# Patient Record
Sex: Female | Born: 2000 | Race: Black or African American | Hispanic: No | Marital: Single | Smoking: Never smoker
Health system: Southern US, Community
[De-identification: ages and names within clinical notes are randomized; demographics above are authoritative.]

---

## 2020-04-10 ENCOUNTER — Encounter: Payer: Self-pay | Admitting: Emergency Medicine

## 2020-04-10 ENCOUNTER — Emergency Department: Payer: Medicaid Other

## 2020-04-10 ENCOUNTER — Other Ambulatory Visit: Payer: Self-pay

## 2020-04-10 ENCOUNTER — Emergency Department
Admission: EM | Admit: 2020-04-10 | Discharge: 2020-04-10 | Disposition: A | Discharge status: Home / Self Care | Payer: Medicaid Other | Attending: Emergency Medicine | Admitting: Emergency Medicine

## 2020-04-10 DIAGNOSIS — Z3A01 Less than 8 weeks gestation of pregnancy: Secondary | ICD-10-CM | POA: Insufficient documentation

## 2020-04-10 DIAGNOSIS — O209 Hemorrhage in early pregnancy, unspecified: Secondary | ICD-10-CM | POA: Diagnosis not present

## 2020-04-10 DIAGNOSIS — O200 Threatened abortion: Secondary | ICD-10-CM | POA: Diagnosis not present

## 2020-04-10 LAB — LIPASE, BLOOD: Lipase: 35 U/L (ref 11–51)

## 2020-04-10 LAB — PREGNANCY, URINE: Preg Test, Ur: POSITIVE — AB

## 2020-04-10 LAB — URINALYSIS, COMPLETE (UACMP) WITH MICROSCOPIC
Bacteria, UA: NONE SEEN
Bilirubin Urine: NEGATIVE
Glucose, UA: NEGATIVE mg/dL
Ketones, ur: NEGATIVE mg/dL
Nitrite: NEGATIVE
Protein, ur: NEGATIVE mg/dL
Specific Gravity, Urine: 1.025 (ref 1.005–1.030)
pH: 7 (ref 5.0–8.0)

## 2020-04-10 LAB — COMPREHENSIVE METABOLIC PANEL
ALT: 21 U/L (ref 0–44)
AST: 20 U/L (ref 15–41)
Albumin: 4.2 g/dL (ref 3.5–5.0)
Alkaline Phosphatase: 61 U/L (ref 38–126)
Anion gap: 6 (ref 5–15)
BUN: 15 mg/dL (ref 6–20)
CO2: 26 mmol/L (ref 22–32)
Calcium: 9.4 mg/dL (ref 8.9–10.3)
Chloride: 104 mmol/L (ref 98–111)
Creatinine, Ser: 0.61 mg/dL (ref 0.44–1.00)
GFR calc Af Amer: >60 mL/min (ref >60)
GFR calc non Af Amer: >60 mL/min (ref >60)
Glucose, Bld: 86 mg/dL (ref 70–99)
Potassium: 3.8 mmol/L (ref 3.5–5.1)
Sodium: 136 mmol/L (ref 135–145)
Total Bilirubin: 0.6 mg/dL (ref 0.3–1.2)
Total Protein: 8.1 g/dL (ref 6.5–8.1)

## 2020-04-10 LAB — CBC
HCT: 32.2 % — ABNORMAL LOW (ref 36.0–46.0)
Hemoglobin: 10.7 g/dL — ABNORMAL LOW (ref 12.0–15.0)
MCH: 27 pg (ref 26.0–34.0)
MCHC: 33.2 g/dL (ref 30.0–36.0)
MCV: 81.1 fL (ref 80.0–100.0)
Platelets: 257 10*3/uL (ref 150–400)
RBC: 3.97 MIL/uL (ref 3.87–5.11)
RDW: 16.7 % — ABNORMAL HIGH (ref 11.5–15.5)
WBC: 5.9 10*3/uL (ref 4.0–10.5)
nRBC: 0 % (ref 0.0–0.2)

## 2020-04-10 LAB — HCG, QUANTITATIVE, PREGNANCY: hCG, Beta Chain, Quant, S: 44388 m[IU]/mL — ABNORMAL HIGH (ref <5)

## 2020-04-10 LAB — ABO/RH: ABO/RH(D): B POS

## 2020-04-10 MED ORDER — SODIUM CHLORIDE 0.9% FLUSH: 3.0000 mL | Freq: Once | INTRAVENOUS | Status: DC

## 2020-04-10 NOTE — Discharge Instructions (Addendum)
Follow-up with your regular doctor for your already scheduled appointment next week.  Return the emergency department if you have severe abdominal pain or heavy bleeding.  Continue to take your prenatal vitamins.  Rest.  Do not do heavy lifting at this time.

## 2020-04-10 NOTE — ED Provider Notes (Signed)
[facility] Emergency Department Provider Note  ____________________________________________   First MD Initiated Contact with Patient 04/10/20 1528     (approximate)  I have reviewed the triage vital signs and the nursing notes.   HISTORY  Chief Complaint Abdominal Pain    HPI Margarette Daffron is a 19 y.o. female presents emergency department with concerns of vaginal bleeding in early pregnancy.  Patient states she had her first ultrasound done last week at the OB/GYN doctor in [place] and they did not see any cardiac activity.  She states she has had some cramping and light vaginal bleeding.  Symptoms started last night.  She denies fever or chills.    History reviewed. No pertinent past medical history.  There are no problems to display for this patient.   History reviewed. No pertinent surgical history.  Prior to Admission medications   Not on File    Allergies Shellfish allergy  History reviewed. No pertinent family history.  Social History Social History   Tobacco Use  . Smoking status: Never Smoker  . Smokeless tobacco: Never Used  Substance Use Topics  . Alcohol use: Not on file  . Drug use: Not on file    Review of Systems  Constitutional: No fever/chills Eyes: No visual changes. ENT: No sore throat. Respiratory: Denies cough Cardiovascular: Denies chest pain Gastrointestinal: Denies abdominal cramping and vaginal bleeding Genitourinary: Negative for dysuria. Musculoskeletal: Negative for back pain. Skin: Negative for rash. Psychiatric: no mood changes,     ____________________________________________   PHYSICAL EXAM:  VITAL SIGNS: ED Triage Vitals [04/10/20 1314]  Enc Vitals Group     BP 101/78     Pulse Rate 80     Resp 18     Temp 98.6 F (37 C)     Temp src      SpO2 99 %     Weight 141 lb (64 kg)     Height 5\' 3"  (1.6 m)     Head Circumference      Peak Flow      Pain Score 2     Pain Loc       Pain Edu?      Excl. in GC?     Constitutional: Alert and oriented. Well appearing and in no acute distress. Eyes: Conjunctivae are normal.  Head: Atraumatic. Nose: No congestion/rhinnorhea.   Neck:  supple no lymphadenopathy noted Cardiovascular: Normal rate, regular rhythm. Heart sounds are normal Respiratory: Normal respiratory effort.  No retractions, lungs c t a  Abd: soft nontender bs normal all 4 quad GU: deferred Musculoskeletal: FROM all extremities, warm and well perfused Neurologic:  Normal speech and language.  Skin:  Skin is warm, dry and intact. No rash noted. Psychiatric: Mood and affect are normal. Speech and behavior are normal.  ____________________________________________   LABS (all labs ordered are listed, but only abnormal results are displayed)  Labs Reviewed  CBC - Abnormal; Notable for the following components:      Result Value   Hemoglobin 10.7 (*)    HCT 32.2 (*)    RDW 16.7 (*)    All other components within normal limits  URINALYSIS, COMPLETE (UACMP) WITH MICROSCOPIC - Abnormal; Notable for the following components:   Color, Urine YELLOW (*)    APPearance CLOUDY (*)    Hgb urine dipstick LARGE (*)    Leukocytes,Ua TRACE (*)    All other components within normal limits  HCG, QUANTITATIVE, PREGNANCY - Abnormal; Notable for the following components:  hCG, Beta Chain, Quant, [place] 47,096 (*)    All other components within normal limits  PREGNANCY, URINE - Abnormal; Notable for the following components:   Preg Test, Ur POSITIVE (*)    All other components within normal limits  LIPASE, BLOOD  COMPREHENSIVE METABOLIC PANEL  ABO/RH   ____________________________________________   ____________________________________________  RADIOLOGY  Ultrasound OB less than 14 weeks shows a IUP 6 weeks with no cardiac activity  ____________________________________________   PROCEDURES  Procedure(s) performed:  No  Procedures    ____________________________________________   INITIAL IMPRESSION / ASSESSMENT AND PLAN / ED COURSE  Pertinent labs & imaging results that were available during my care of the patient were reviewed by me and considered in my medical decision making (see chart for details).   The patient is a 19 year old female presents emergency department with concerns of threatened miscarriage.  Physical exam is unremarkable.  DDx: IUP, threatened miscarriage, subchorionic hemorrhage, ectopic  CBC has decreased H&H of 10.7/32.2, ABO/Rh is being positive, beta hCG is 44,388, POC pregnancy is positive, urinalysis only has trace leuks, comprehensive metabolic panel is normal.  Ultrasound OB less than 14 weeks shows a IUP of approximately 6 weeks with no cardiac activity.  Explained to the patient labs are reassuring, she states she has a history of being anemic.  Explained her this can be common in pregnancy 2.  I encouraged her to rest.  Continue take her prenatal pills.  She already has an appointment with her OB/GYN in [place], [place] for a follow-up appointment this week.  I explained to her they will need to do serial beta hCGs to determine if she is having a miscarriage or not.  Explained to her that everything is still in the correct place however there is no cardiac activity.  Also explained to her you may not see cardiac activity until after 8 weeks of pregnancy.  She states she understands.  She was discharged stable condition in the care of her mother.         As part of my medical decision making, I reviewed the following data within the electronic MEDICAL RECORD NUMBER Nursing notes reviewed and incorporated, Labs reviewed , Old chart reviewed, Radiograph reviewed , Notes from prior ED visits and [place] Controlled Substance Database  ____________________________________________   FINAL CLINICAL IMPRESSION(S) / ED DIAGNOSES  Final diagnoses:  Vaginal bleeding  affecting early pregnancy  Threatened miscarriage in early pregnancy      NEW MEDICATIONS STARTED DURING THIS VISIT:  New Prescriptions   No medications on file     Note:  This document was prepared using Dragon voice recognition software and may include unintentional dictation errors.    Faythe Ghee, PA-C 04/10/20 1548    Jene Every, MD 04/10/20 [phone]

## 2020-04-10 NOTE — ED Notes (Signed)
Pt currently in [place] at this time, will obtain repeat VS when patient returns.

## 2020-04-10 NOTE — ED Triage Notes (Signed)
Pt to ER reports she is approximately [redacted] weeks pregnant.  Reports abdominal cramping and light vaginal bleeding since last night.

## 2020-04-12 ENCOUNTER — Encounter: Payer: Self-pay | Admitting: *Deleted

## 2020-04-12 ENCOUNTER — Other Ambulatory Visit: Payer: Self-pay

## 2020-04-12 DIAGNOSIS — O039 Complete or unspecified spontaneous abortion without complication: Secondary | ICD-10-CM | POA: Insufficient documentation

## 2020-04-12 DIAGNOSIS — Z3A01 Less than 8 weeks gestation of pregnancy: Secondary | ICD-10-CM | POA: Insufficient documentation

## 2020-04-12 DIAGNOSIS — O26891 Other specified pregnancy related conditions, first trimester: Secondary | ICD-10-CM | POA: Diagnosis present

## 2020-04-12 LAB — CBC
HCT: 32.1 % — ABNORMAL LOW (ref 36.0–46.0)
Hemoglobin: 10.4 g/dL — ABNORMAL LOW (ref 12.0–15.0)
MCH: 26.9 pg (ref 26.0–34.0)
MCHC: 32.4 g/dL (ref 30.0–36.0)
MCV: 83.2 fL (ref 80.0–100.0)
Platelets: 270 10*3/uL (ref 150–400)
RBC: 3.86 MIL/uL — ABNORMAL LOW (ref 3.87–5.11)
RDW: 17.2 % — ABNORMAL HIGH (ref 11.5–15.5)
WBC: 9.8 10*3/uL (ref 4.0–10.5)
nRBC: 0 % (ref 0.0–0.2)

## 2020-04-12 LAB — HCG, QUANTITATIVE, PREGNANCY: hCG, Beta Chain, Quant, S: 26569 m[IU]/mL — ABNORMAL HIGH (ref <5)

## 2020-04-12 NOTE — ED Triage Notes (Addendum)
Pt stating she is approx [redacted] weeks pregnant, reporting that she has been having lower abdominal cramping today. She has been having spotting since Sunday (seen here for the same). Returning d/t continued abdominal pain, vaginal bleeding not changed.   (While waiting in lobby for triage, pt states that she feels like something is between her legs, took patient to restroom, pt noted to have increased vaginal bleeding with some clotting. )

## 2020-04-13 ENCOUNTER — Emergency Department
Admission: EM | Admit: 2020-04-13 | Discharge: 2020-04-13 | Disposition: A | Discharge status: Home / Self Care | Payer: Medicaid Other | Attending: Emergency Medicine | Admitting: Emergency Medicine

## 2020-04-13 ENCOUNTER — Emergency Department: Payer: Medicaid Other

## 2020-04-13 DIAGNOSIS — O039 Complete or unspecified spontaneous abortion without complication: Secondary | ICD-10-CM

## 2020-04-13 DIAGNOSIS — O4690 Antepartum hemorrhage, unspecified, unspecified trimester: Secondary | ICD-10-CM

## 2020-04-13 NOTE — ED Notes (Signed)
Pt states she is approximately [redacted] weeks pregnant and started having small amount of vaginal bleeding on Sunday and came to the ED, states everything looked ok at that time but the pain and bleeding progressed, states during the ultrasound she passed a large clot and possibly the gestational sac. States she is having mild cramping at present.

## 2020-04-13 NOTE — ED Provider Notes (Signed)
[facility] Emergency Department Provider Note   ____________________________________________   First MD Initiated Contact with Patient 04/13/20 [phone]     (approximate)  I have reviewed the triage vital signs and the nursing notes.   HISTORY  Chief Complaint Abdominal Pain    HPI Renee Morris is a 19 y.o. female, G1 P0 at approximately 7 weeks of pregnancy presents to the ED complaining of abdominal pain.  Patient reports that she initially noticed a small amount of vaginal bleeding 3 days ago, was evaluated in the ED at that time with ultrasound concerning for failed pregnancy.  She had worsening pain develop last night which she describes as a pelvic cramping, initially severe but has since eased off.  She noticed an increase in bleeding at this time and passed a small clot.  She also reports passing a small piece of tissue just after her ultrasound was performed.  Pain and bleeding have improved since then and she only complains of mild pain currently.  She denies any dysuria, hematuria, fevers, nausea, vomiting, or diarrhea.        History reviewed. No pertinent past medical history.  There are no problems to display for this patient.   History reviewed. No pertinent surgical history.  Prior to Admission medications   Not on File    Allergies Shellfish allergy  No family history on file.  Social History Social History   Tobacco Use  . Smoking status: Never Smoker  . Smokeless tobacco: Never Used  Substance Use Topics  . Alcohol use: Not on file  . Drug use: Not on file    Review of Systems  Constitutional: No fever/chills Eyes: No visual changes. ENT: No sore throat. Cardiovascular: Denies chest pain. Respiratory: Denies shortness of breath. Gastrointestinal: Positive for abdominal pain.  No nausea, no vomiting.  No diarrhea.  No constipation. Genitourinary: Negative for dysuria.  Positive for vaginal bleeding. Musculoskeletal:  Negative for back pain. Skin: Negative for rash. Neurological: Negative for headaches, focal weakness or numbness.  ____________________________________________   PHYSICAL EXAM:  VITAL SIGNS: ED Triage Vitals [04/12/20 2138]  Enc Vitals Group     BP (!) 112/58     Pulse Rate 80     Resp 17     Temp 99.2 F (37.3 C)     Temp Source Oral     SpO2 99 %     Weight      Height      Head Circumference      Peak Flow      Pain Score 5     Pain Loc      Pain Edu?      Excl. in GC?     Constitutional: Alert and oriented. Eyes: Conjunctivae are normal. Head: Atraumatic. Nose: No congestion/rhinnorhea. Mouth/Throat: Mucous membranes are moist. Neck: Normal ROM Cardiovascular: Normal rate, regular rhythm. Grossly normal heart sounds. Respiratory: Normal respiratory effort.  No retractions. Lungs CTAB. Gastrointestinal: Soft and nontender. No distention. Genitourinary: Scant amount of vaginal bleeding with a closed cervical os, no observed tissue or clots. Musculoskeletal: No lower extremity tenderness nor edema. Neurologic:  Normal speech and language. No gross focal neurologic deficits are appreciated. Skin:  Skin is warm, dry and intact. No rash noted. Psychiatric: Mood and affect are normal. Speech and behavior are normal.  ____________________________________________   LABS (all labs ordered are listed, but only abnormal results are displayed)  Labs Reviewed  CBC - Abnormal; Notable for the following components:  Result Value   RBC 3.86 (*)    Hemoglobin 10.4 (*)    HCT 32.1 (*)    RDW 17.2 (*)    All other components within normal limits  HCG, QUANTITATIVE, PREGNANCY - Abnormal; Notable for the following components:   hCG, Beta Chain, Quant, S 26,569 (*)    All other components within normal limits     PROCEDURES  Procedure(s) performed (including Critical Care):  Procedures   ____________________________________________   INITIAL IMPRESSION /  ASSESSMENT AND PLAN / ED COURSE       19 year old female, G1, P0 at approximately 7 weeks of pregnancy, presents to the ED complaining of cramping lower abdominal pain along with worsening vaginal bleeding starting last night.  She reports passing a small amount of tissue since then, after which pain and bleeding have improved.  Pelvic exam shows a closed cervical os with scant bleeding, ultrasound also shows evacuation of previously visualized gestational sac.  This is consistent with completed miscarriage, hemoglobin is stable at this time and given resolving bleeding patient is appropriate for outpatient follow-up with OB/GYN.  She states she has an appointment scheduled for tomorrow, which I have advised her to keep.  She was counseled to return to the ED for new or worsening symptoms, patient agrees with plan.      ____________________________________________   FINAL CLINICAL IMPRESSION(S) / ED DIAGNOSES  Final diagnoses:  Complete miscarriage     ED Discharge Orders    None       Note:  This document was prepared using Dragon voice recognition software and may include unintentional dictation errors.   Chesley Noon, MD 04/13/20 [phone]

## 2020-04-13 NOTE — ED Notes (Signed)
Pt says she has not had to go back to the bathroom since triage

## 2021-10-12 IMAGING — US US OB < 14 WEEKS - US OB TV
1 series · 14 of 28 positions shown · non-contrast
Comparison: None.

CLINICAL DATA: Vaginal bleeding. Quantitative beta HCG [DATE].
Estimated gestational age per LMP 9 weeks 4 days.

EXAM:
OBSTETRIC <14 WK US AND TRANSVAGINAL OB US
TECHNIQUE: Both transabdominal and transvaginal ultrasound examinations were
performed for complete evaluation of the gestation as well as the
maternal uterus, adnexal regions, and pelvic cul-de-sac.
Transvaginal technique was performed to assess early pregnancy.

[Series 1: ob us · 89 acquisitions, 14 frames shown]
[im 4/89]
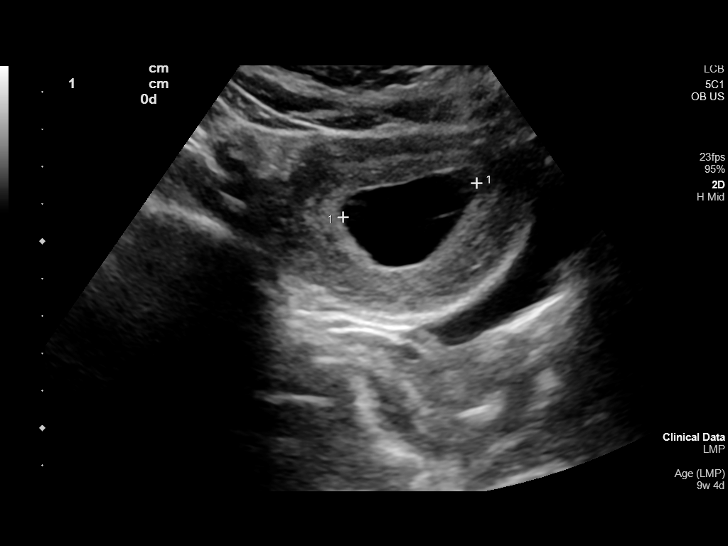
[im 10/89]
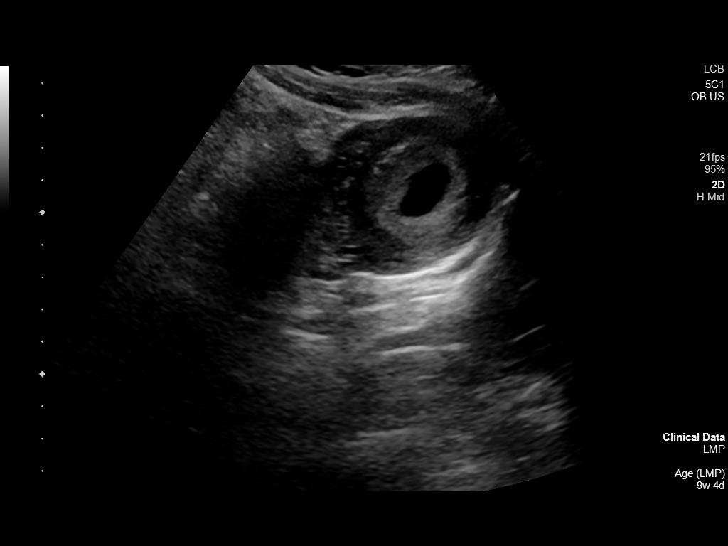
[im 17/89]
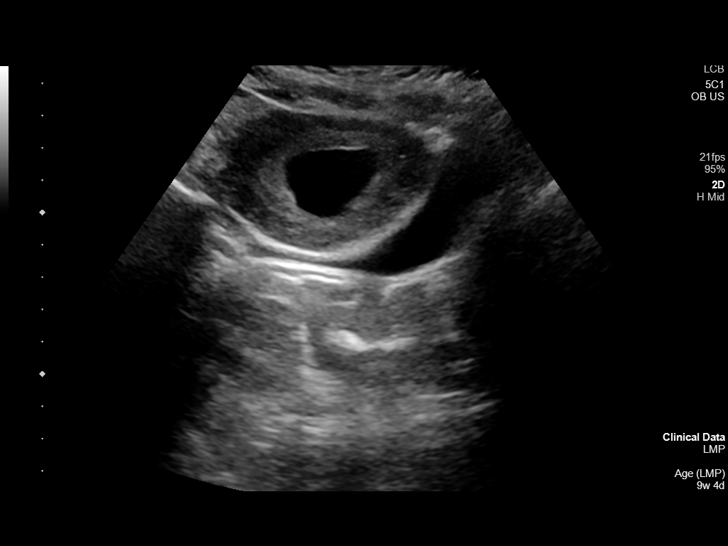
[im 23/89]
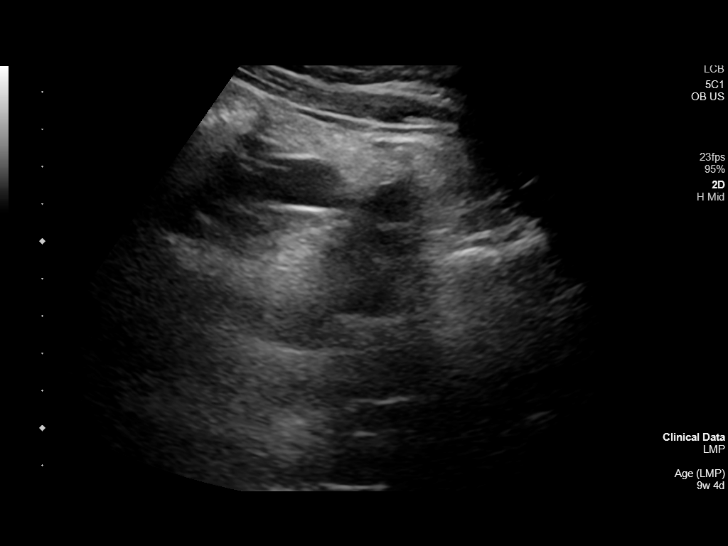
[im 30/89]
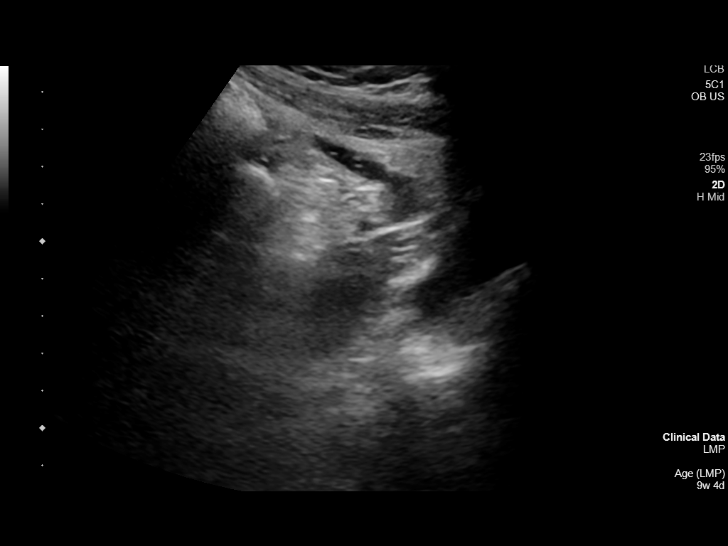
[im 36/89]
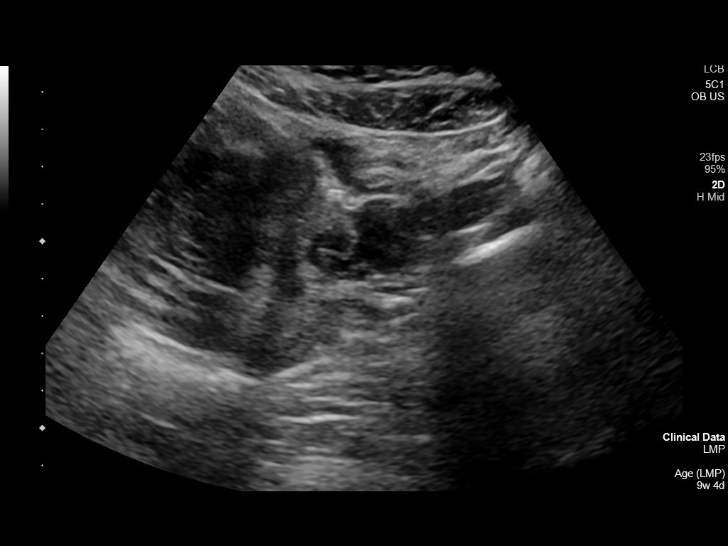
[im 43/89]
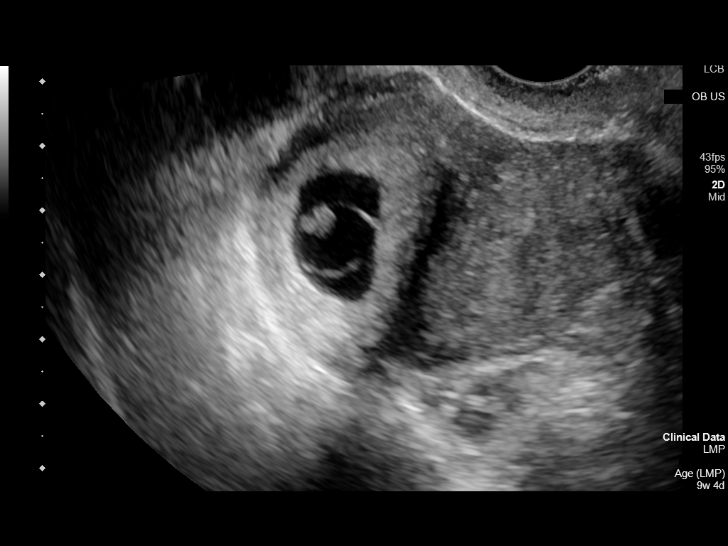
[im 49/89]
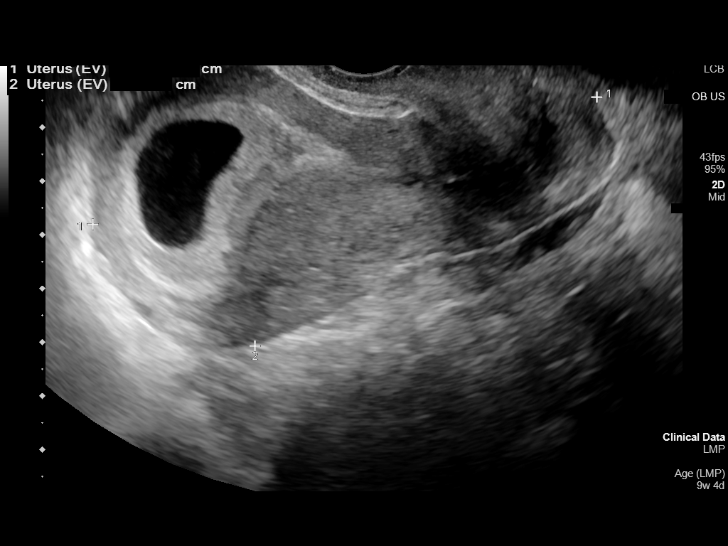
[im 56/89]
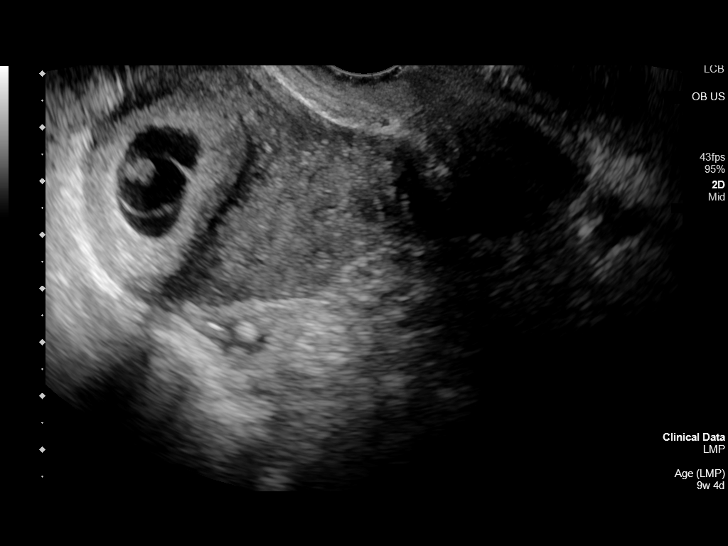
[im 62/89]
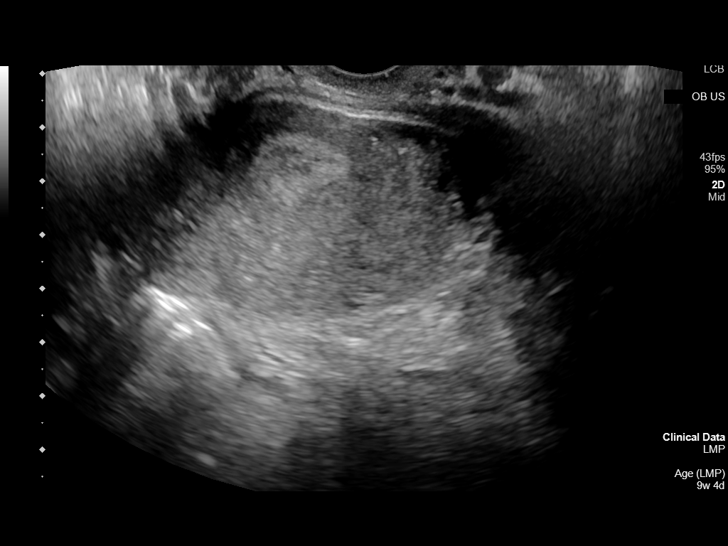
[im 69/89]
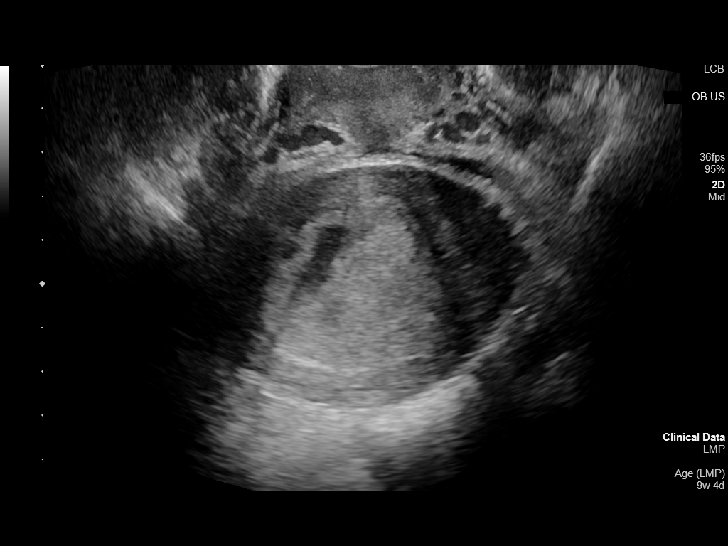
[im 75/89]
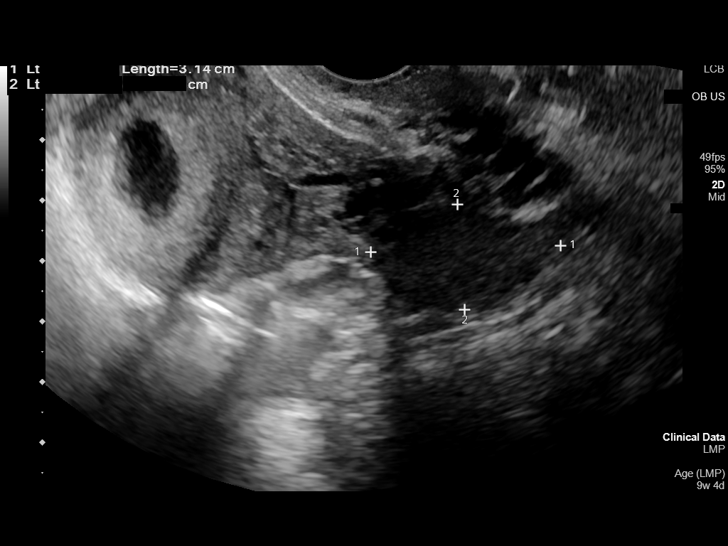
[im 82/89]
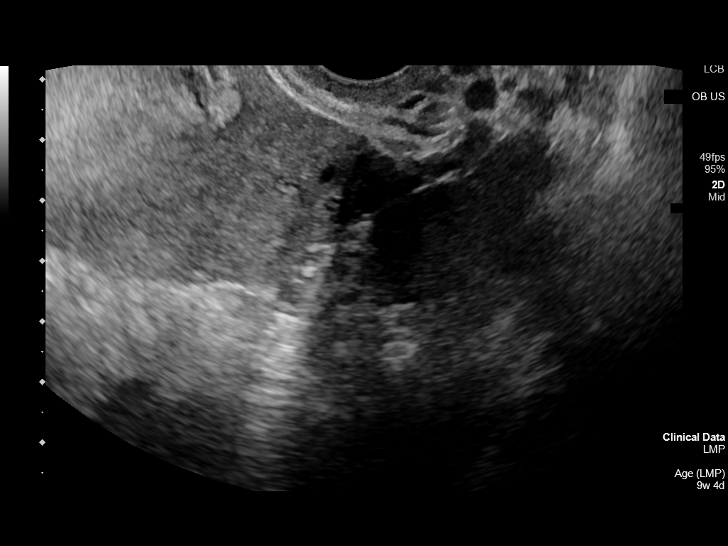
[im 89/89]
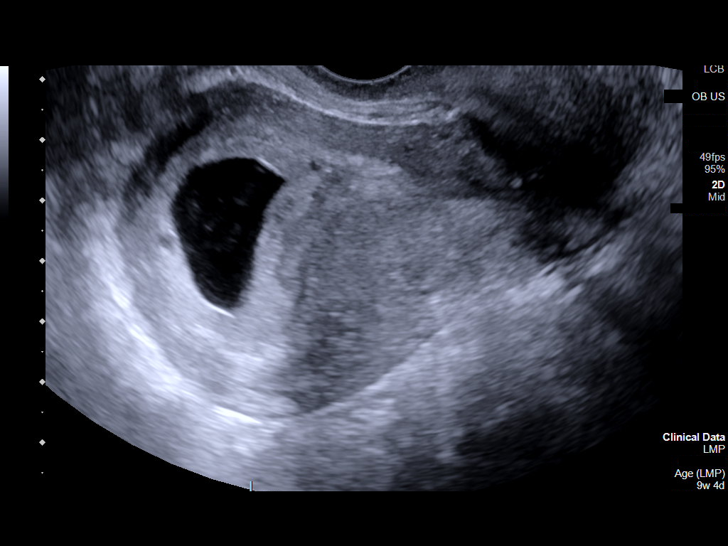

[14 of 28 positions shown; findings below may reference images not displayed]

FINDINGS: Intrauterine gestational sac: Visualized.

Yolk sac:  Visualized.

Embryo:  Visualized.

Cardiac Activity: Not visualized.

Heart Rate: Not visualized.  Bpm

MSD: 28.9 mm

CRL:  5.3 mm   6 w   2 d                  US EDC: 12/02/2020

Subchorionic hemorrhage:  Small subchorionic hemorrhage present.

Maternal uterus/adnexae: Right ovary not visualized. Left ovary is
normal. No free fluid.
IMPRESSION: Intrauterine gestational sac with yolk sac and embryo, but no
visualized cardiac activity. Findings are suspicious for, but not
definitive of failed pregnancy. Recommend follow-up serial
quantitative beta HCG and follow-up ultrasound 7-10 days.

## 2021-10-15 IMAGING — US US OB < 14 WEEKS - US OB TV
1 series · 14 of 28 positions shown · non-contrast
Comparison: Three days ago

CLINICAL DATA: Vaginal bleeding and lower abdominal cramping for 3
days

EXAM:
OBSTETRIC <14 WK US AND TRANSVAGINAL OB US
TECHNIQUE: Both transabdominal and transvaginal ultrasound examinations were
performed for complete evaluation of the gestation as well as the
maternal uterus, adnexal regions, and pelvic cul-de-sac.
Transvaginal technique was performed to assess early pregnancy.

[Series 1: us ob less than 14 weeks with ob transvaginal · 14 of 97 slices shown]
[im 4/97]
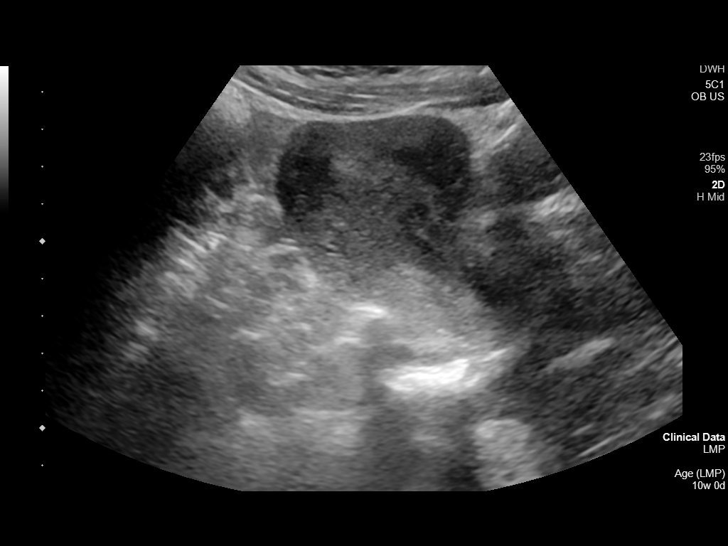
[im 11/97]
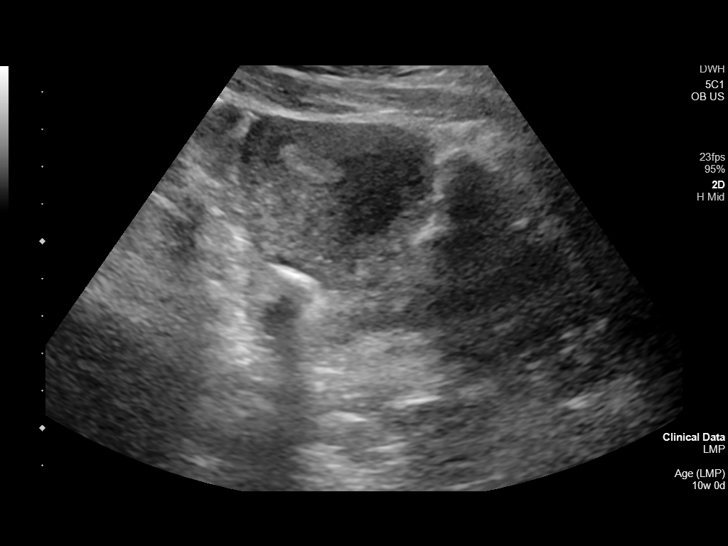
[im 18/97]
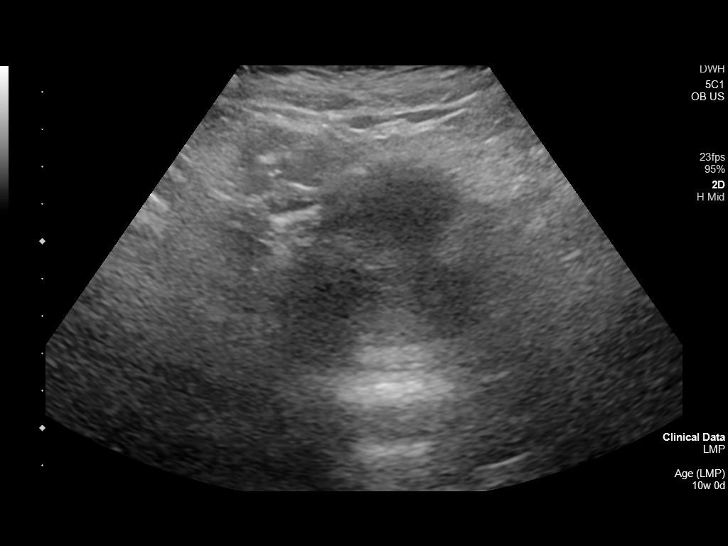
[im 25/97]
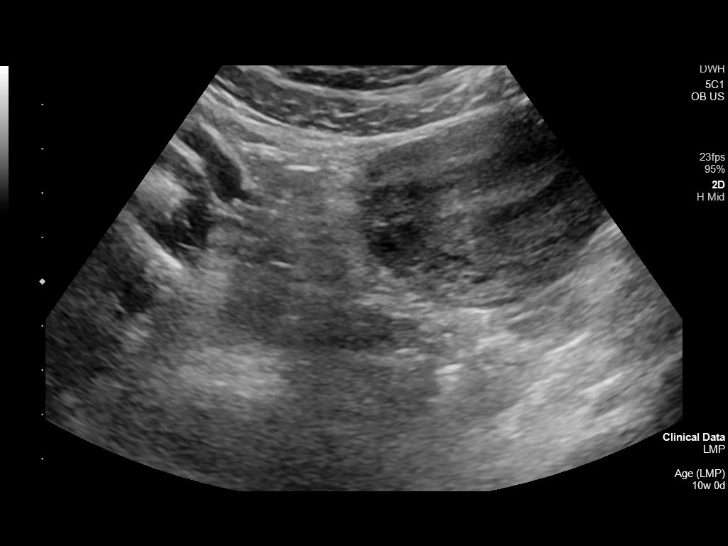
[im 33/97]
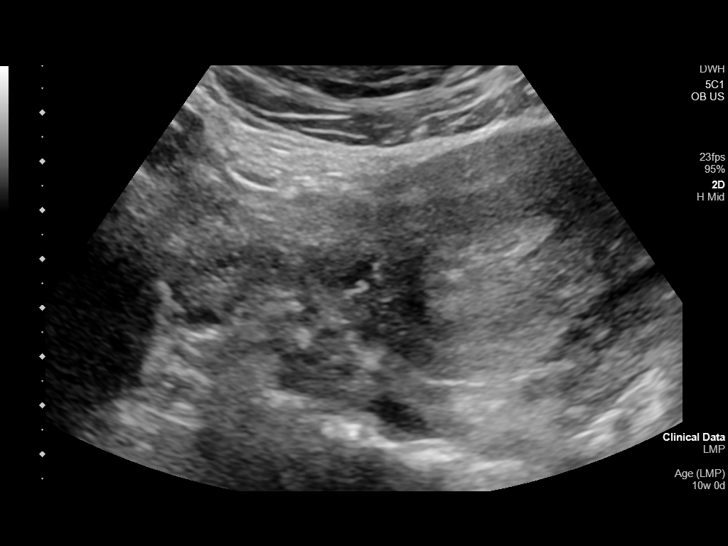
[im 40/97]
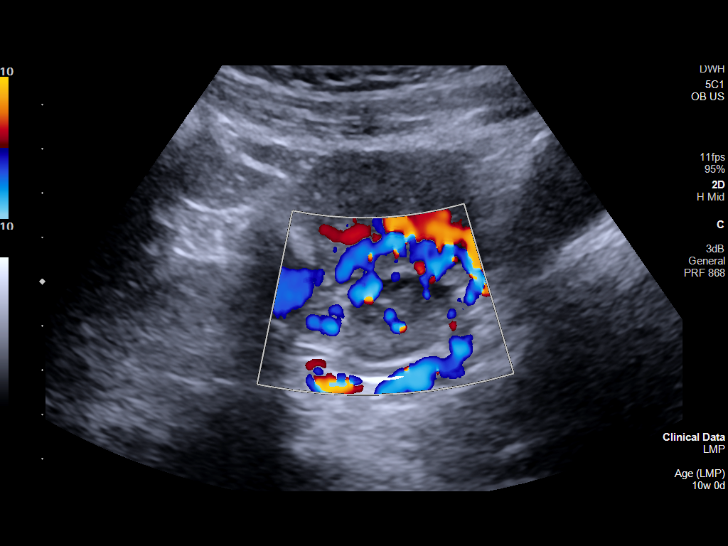
[im 47/97]
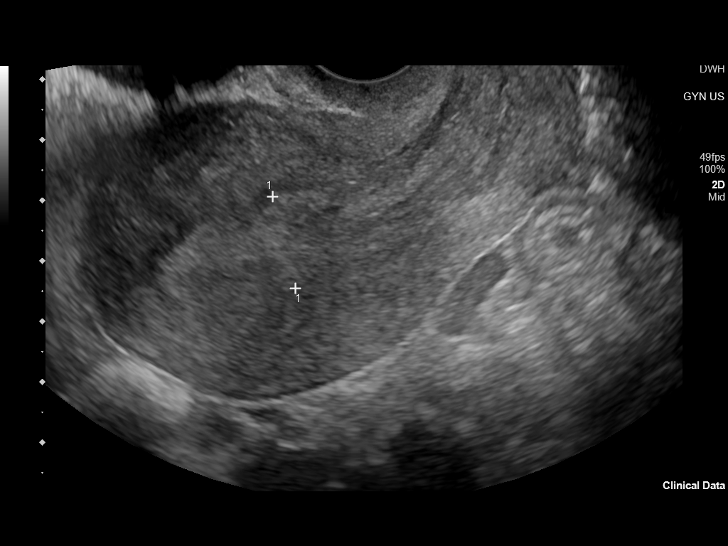
[im 54/97]
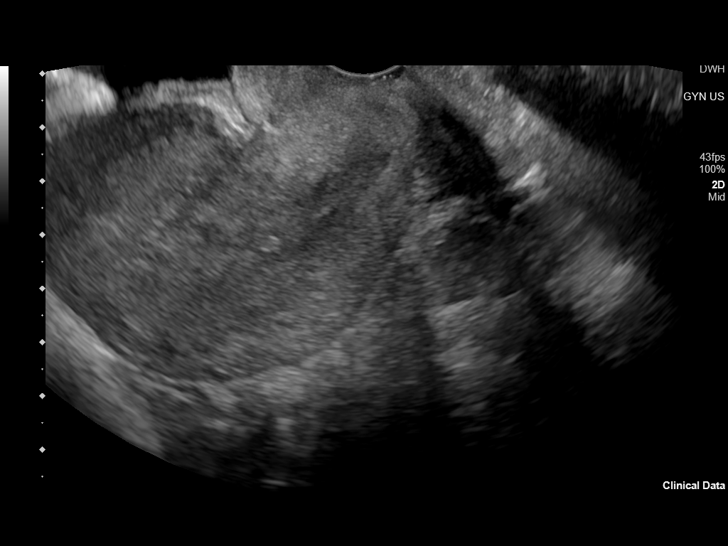
[im 61/97]
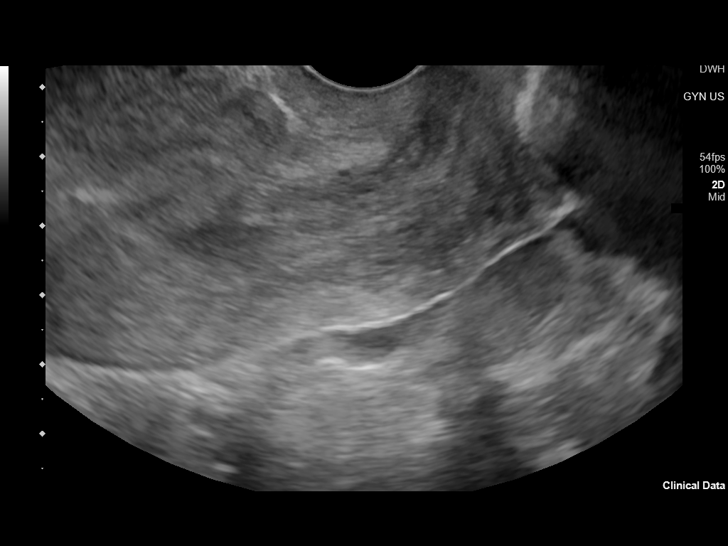
[im 68/97]
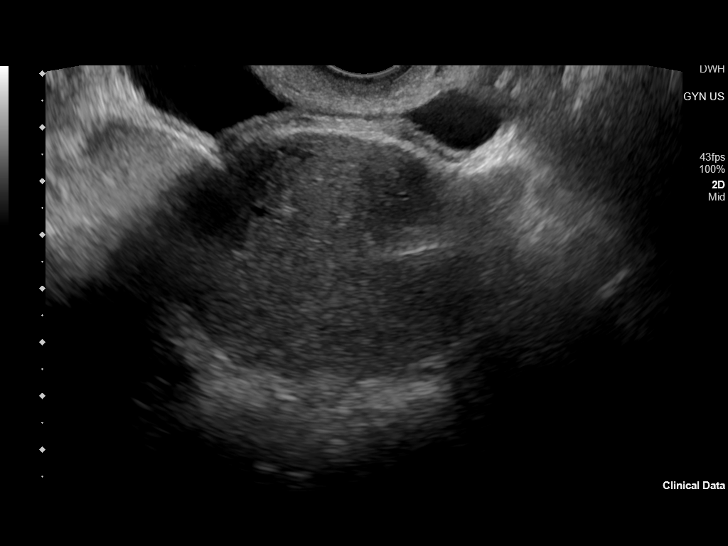
[im 75/97]
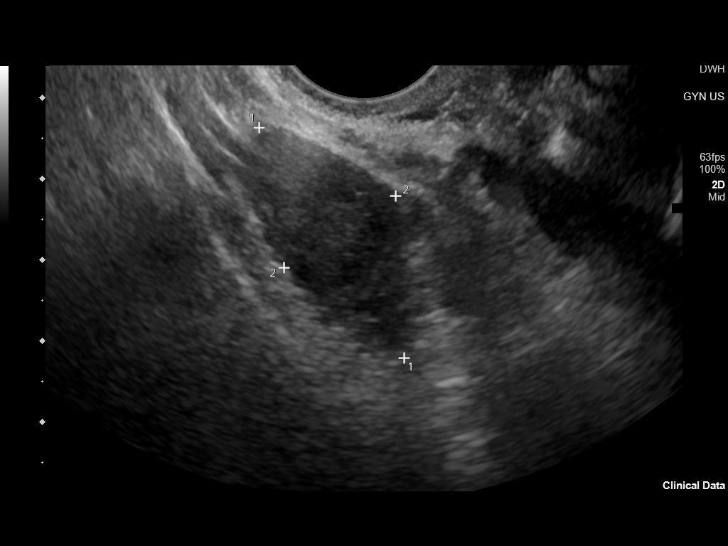
[im 82/97]
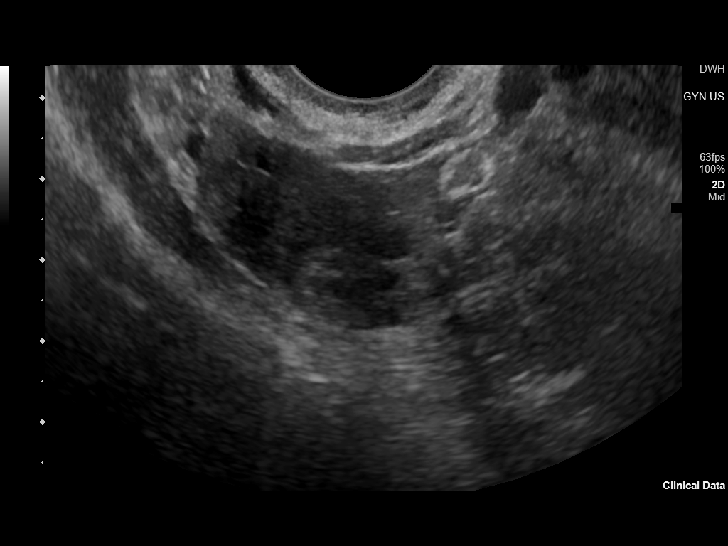
[im 89/97]
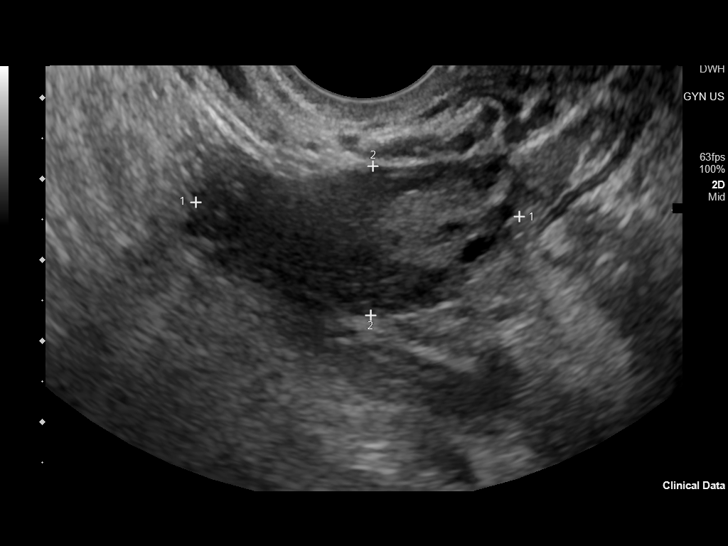
[im 97/97]
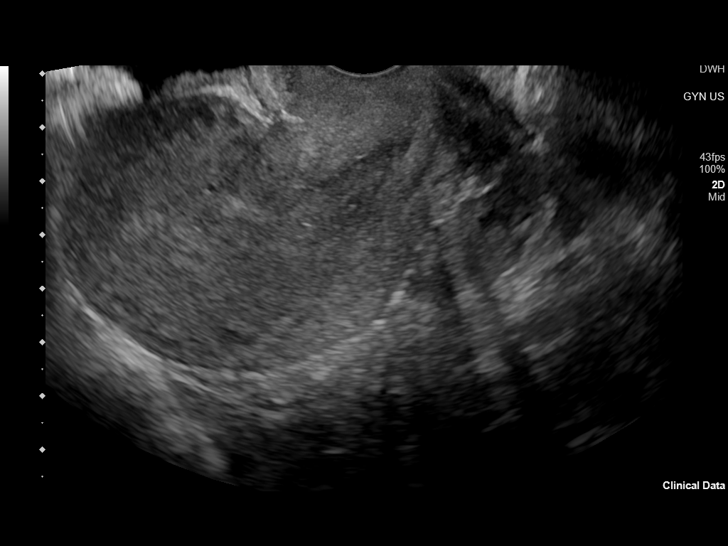

[14 of 28 positions shown; findings below may reference images not displayed]

FINDINGS: Intrauterine gestational sac: No longer seen

Maternal uterus/adnexae: Mildly heterogeneous endometrium with
prominent adjacent vascular flow along the posterior wall, 16 mm in
thickness. Unremarkable ovaries. No pelvic fluid.
IMPRESSION: 1. Spontaneous abortion with interval evacuation of the previously
seen gestational sac.
2. Heterogeneous endometrium measuring 16 mm thickness.
